# Patient Record
Sex: Female | Born: 1980 | Race: White | Hispanic: No | Marital: Married | Smoking: Current every day smoker
Health system: Southern US, Community
[De-identification: ages and names within clinical notes are randomized; demographics above are authoritative.]

## PROBLEM LIST (undated history)

## (undated) DIAGNOSIS — S82851A Displaced trimalleolar fracture of right lower leg, initial encounter for closed fracture: Secondary | ICD-10-CM

---
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
## 2006-02-20 HISTORY — PX: BREAST LUMPECTOMY: SHX2

## 2006-05-14 ENCOUNTER — Encounter: Admission: RE | Admit: 2006-05-14 | Discharge: 2006-05-14 | Payer: Self-pay | Admitting: *Deleted

## 2006-12-15 ENCOUNTER — Emergency Department (HOSPITAL_COMMUNITY): Admission: EM | Admit: 2006-12-15 | Discharge: 2006-12-16 | Payer: Self-pay | Admitting: Emergency Medicine

## 2007-03-11 ENCOUNTER — Ambulatory Visit (HOSPITAL_COMMUNITY): Admission: RE | Admit: 2007-03-11 | Discharge: 2007-03-11 | Payer: Self-pay | Admitting: *Deleted

## 2007-03-11 ENCOUNTER — Encounter (INDEPENDENT_AMBULATORY_CARE_PROVIDER_SITE_OTHER): Payer: Self-pay | Admitting: *Deleted

## 2007-03-11 HISTORY — PX: LAPAROSCOPIC OVARIAN CYSTECTOMY: SHX6248

## 2007-03-13 ENCOUNTER — Emergency Department (HOSPITAL_COMMUNITY): Admission: EM | Admit: 2007-03-13 | Discharge: 2007-03-13 | Payer: Self-pay | Admitting: Emergency Medicine

## 2009-09-12 ENCOUNTER — Emergency Department (HOSPITAL_COMMUNITY): Admission: EM | Admit: 2009-09-12 | Discharge: 2009-09-12 | Payer: Self-pay | Admitting: Emergency Medicine

## 2009-09-13 ENCOUNTER — Ambulatory Visit: Payer: Self-pay | Admitting: Psychiatry

## 2009-09-13 ENCOUNTER — Inpatient Hospital Stay (HOSPITAL_COMMUNITY)
Admission: AD | Admit: 2009-09-13 | Discharge: 2009-09-14 | Payer: Self-pay | Source: Home / Self Care | Admitting: Psychiatry

## 2010-03-23 ENCOUNTER — Emergency Department (HOSPITAL_BASED_OUTPATIENT_CLINIC_OR_DEPARTMENT_OTHER)
Admission: EM | Admit: 2010-03-23 | Discharge: 2010-03-23 | Disposition: A | Discharge status: Home / Self Care | Payer: Self-pay | Attending: Emergency Medicine | Admitting: Emergency Medicine

## 2010-03-23 ENCOUNTER — Other Ambulatory Visit (HOSPITAL_BASED_OUTPATIENT_CLINIC_OR_DEPARTMENT_OTHER): Payer: Self-pay

## 2010-03-23 ENCOUNTER — Emergency Department (INDEPENDENT_AMBULATORY_CARE_PROVIDER_SITE_OTHER): Payer: Self-pay

## 2010-03-23 ENCOUNTER — Ambulatory Visit (INDEPENDENT_AMBULATORY_CARE_PROVIDER_SITE_OTHER)
Admission: RE | Admit: 2010-03-23 | Discharge: 2010-03-23 | Disposition: A | Discharge status: Home / Self Care | Payer: Self-pay | Source: Ambulatory Visit | Attending: Emergency Medicine | Admitting: Emergency Medicine

## 2010-03-23 ENCOUNTER — Other Ambulatory Visit (HOSPITAL_BASED_OUTPATIENT_CLINIC_OR_DEPARTMENT_OTHER): Payer: Self-pay | Admitting: Emergency Medicine

## 2010-03-23 DIAGNOSIS — F17200 Nicotine dependence, unspecified, uncomplicated: Secondary | ICD-10-CM | POA: Insufficient documentation

## 2010-03-23 DIAGNOSIS — R52 Pain, unspecified: Secondary | ICD-10-CM

## 2010-03-23 DIAGNOSIS — N760 Acute vaginitis: Secondary | ICD-10-CM | POA: Insufficient documentation

## 2010-03-23 DIAGNOSIS — R109 Unspecified abdominal pain: Secondary | ICD-10-CM

## 2010-03-23 DIAGNOSIS — R1031 Right lower quadrant pain: Secondary | ICD-10-CM

## 2010-03-23 DIAGNOSIS — A499 Bacterial infection, unspecified: Secondary | ICD-10-CM | POA: Insufficient documentation

## 2010-03-23 DIAGNOSIS — B9689 Other specified bacterial agents as the cause of diseases classified elsewhere: Secondary | ICD-10-CM | POA: Insufficient documentation

## 2010-03-23 LAB — CBC
HCT: 40.8 % (ref 36.0–46.0)
MCH: 31.1 pg (ref 26.0–34.0)
Platelets: 292 10*3/uL (ref 150–400)
RDW: 13 % (ref 11.5–15.5)

## 2010-03-23 LAB — URINALYSIS, ROUTINE W REFLEX MICROSCOPIC
Bilirubin Urine: NEGATIVE
Hgb urine dipstick: NEGATIVE
Nitrite: NEGATIVE
Urine Glucose, Fasting: NEGATIVE mg/dL

## 2010-03-23 LAB — WET PREP, GENITAL

## 2010-03-23 LAB — DIFFERENTIAL
Eosinophils Absolute: 0.2 10*3/uL (ref 0.0–0.7)
Eosinophils Relative: 2 % (ref 0–5)
Monocytes Absolute: 1 10*3/uL (ref 0.1–1.0)
Monocytes Relative: 8 % (ref 3–12)
Neutro Abs: 8.3 10*3/uL — ABNORMAL HIGH (ref 1.7–7.7)

## 2010-03-23 LAB — BASIC METABOLIC PANEL
BUN: 20 mg/dL (ref 6–23)
CO2: 23 mEq/L (ref 19–32)
Calcium: 9.1 mg/dL (ref 8.4–10.5)
Creatinine, Ser: 0.8 mg/dL (ref 0.4–1.2)
GFR calc Af Amer: >60 mL/min (ref >60)

## 2010-03-23 MED ORDER — IOHEXOL 300 MG/ML  SOLN
100.0000 mL | Freq: Once | INTRAMUSCULAR | Status: DC | PRN
  Administered 2010-03-23: 100 mL via INTRAVENOUS

## 2010-03-25 LAB — GC/CHLAMYDIA PROBE AMP, GENITAL
Chlamydia, DNA Probe: NEGATIVE
GC Probe Amp, Genital: NEGATIVE

## 2010-05-07 LAB — DIFFERENTIAL
Basophils Relative: 0 % (ref 0–1)
Eosinophils Absolute: 0 10*3/uL (ref 0.0–0.7)
Eosinophils Relative: 0 % (ref 0–5)
Lymphs Abs: 1.6 10*3/uL (ref 0.7–4.0)
Monocytes Absolute: 0.5 10*3/uL (ref 0.1–1.0)
Monocytes Relative: 4 % (ref 3–12)
Neutrophils Relative %: 84 % — ABNORMAL HIGH (ref 43–77)

## 2010-05-07 LAB — POCT PREGNANCY, URINE: Preg Test, Ur: NEGATIVE

## 2010-05-07 LAB — CBC
HCT: 43.6 % (ref 36.0–46.0)
Hemoglobin: 15 g/dL (ref 12.0–15.0)
MCH: 32.6 pg (ref 26.0–34.0)
MCHC: 34.4 g/dL (ref 30.0–36.0)
MCV: 94.6 fL (ref 78.0–100.0)
RBC: 4.61 MIL/uL (ref 3.87–5.11)

## 2010-05-07 LAB — RAPID URINE DRUG SCREEN, HOSP PERFORMED
Amphetamines: NOT DETECTED
Tetrahydrocannabinol: NOT DETECTED

## 2010-05-07 LAB — ACETAMINOPHEN LEVEL
Acetaminophen (Tylenol), Serum: <10 ug/mL — ABNORMAL LOW (ref 10–30)
Acetaminophen (Tylenol), Serum: <10 ug/mL — ABNORMAL LOW (ref 10–30)

## 2010-05-07 LAB — BASIC METABOLIC PANEL
Calcium: 8.6 mg/dL (ref 8.4–10.5)
GFR calc Af Amer: >60 mL/min (ref >60)
Potassium: 4.9 mEq/L (ref 3.5–5.1)

## 2010-07-05 NOTE — Op Note (Signed)
NAMEJULIANNE, [name]                 ACCOUNT NO.:  [id number]   MEDICAL RECORD NO.:  [id number]          PATIENT TYPE:  AMB   LOCATION:  SDC                           FACILITY:  [facility]   PHYSICIAN:  [place] B. Earlene Plater, M.D.  DATE OF BIRTH:  1980/06/04   DATE OF PROCEDURE:  03/11/2007  DATE OF DISCHARGE:                               OPERATIVE REPORT   PREOPERATIVE DIAGNOSIS:  Left lower quadrant pain, left ovarian cyst.   POSTOPERATIVE DIAGNOSIS:  Left lower quadrant pain, left ovarian cyst.   PROCEDURE:  Laparoscopic left ovarian cystectomy.   SURGEON:  Marina Gravel, M.D.   ASSISTANT:  None.   ANESTHESIA:  General.   FINDINGS:  A 7 cm left ovary with approximately three simple-appearing  ovarian cysts.  No signs concerning for malignancy. Left tube is  attenuated over the cyst wall fimbria.  Although attenuated, otherwise  looked normal.  Right tube and ovary were normal.  Uterus normal.  Posterior cul-de-sac:  There was a window just medial to the left  uterosacral ligament, although no evidence of active endometriosis.  Filmy adhesions to the anterior cul-de-sac, although again no  endometriosis seen.  The upper abdomen appeared normal.   INDICATIONS:  The patient with a history of left lower quadrant pain,  large ovarian cyst on ultrasound which appears simple. Preop tumor  markers were negative.  The patient advised of the risks of surgery  including infection, bleeding, damage to surrounding organs.   PROCEDURE:  The patient was taken to the operating room and general  anesthesia obtained.  She was prepped and draped in standard fashion.  Bladder drained with a Foley catheter.  Hulka tenaculum attached to the  anterior lip of the cervix.   A 10 mm incision placed transversely in the umbilicus, carried sharply  to the fascia.  The fascia was divided sharply and elevated with Kocher  clamps.  Posterior sheath of peritoneum elevated and entered sharply.  A  pursestring suture  of 0 Vicryl placed around the fascial defect.  Hassan  cannula inserted and secured.  Pneumoperitoneum obtained with CO2 gas.  An 11 mm trocar placed in left lower quadrant, a 5 mm in the right, each  under direct laparoscopic visualization.   Trendelenburg position obtained.  Pelvis inspected with the above  findings noted.  Left ovary was noted to have filmy adhesions to the  posterior cul-de-sac which were taken down with monopolar scissors.  This mobilized the left ovary.  The cyst wall was dissected away from  the ovarian capsule, although it did rupture on dissection.  Clear fluid  drained. Cyst was removed with sharp and blunt dissection.  Secondary  cyst was noted just inferior to this, removed in a similar manner, and  there may have been a third which drained during the dissection. It was  not clear.  There was a substantial amount of normal appearing ovary  left after the dissection.  The line of dissection was made hemostatic  with the bipolar cautery, copiously irrigated and reinspected, found to  be hemostatic.  Pneumoperitoneum was taken down to  minimal settings and  line of dissection found to be hemostatic.   The inferior ports were removed.  Their sites were hemostatic.  The  scope was removed.  Gas released.  Hassan cannula removed.  Abdominal  wall elevated with [employer] retractors.  Pursestring suture snugged  down.  This obliterated the fascial defect and no intra-abdominal  contents herniated through prior closure.  Skin was closed at each site  with 4-0 Vicryl and Dermabond.   The patient tolerated the procedure well with no complications.  She was  taken to recovery room in stable condition. All counts correct per the  operating staff.      Gerri Spore B. Earlene Plater, M.D.  Electronically Signed     WBD/MEDQ  D:  03/11/2007  T:  03/12/2007  Job:  [id number]

## 2010-11-10 LAB — DIFFERENTIAL
Eosinophils Absolute: 0.3
Lymphs Abs: 2.3
Monocytes Relative: 7
Neutro Abs: 5.5
Neutrophils Relative %: 62

## 2010-11-10 LAB — CBC
MCV: 90.5
Platelets: 337
RBC: 4.57
WBC: 8.9

## 2010-11-10 LAB — PREGNANCY, URINE: Preg Test, Ur: NEGATIVE

## 2010-11-11 LAB — COMPREHENSIVE METABOLIC PANEL
BUN: 7
CO2: 25
Calcium: 8.9
Chloride: 105
Creatinine, Ser: 0.72
GFR calc non Af Amer: >60
Total Bilirubin: 0.8

## 2010-11-11 LAB — CBC
HCT: 36.7
MCHC: 33.8
MCV: 91
RBC: 4.04
WBC: 10.7 — ABNORMAL HIGH

## 2010-11-11 LAB — URINALYSIS, ROUTINE W REFLEX MICROSCOPIC
Bilirubin Urine: NEGATIVE
Glucose, UA: NEGATIVE
Ketones, ur: 15 — AB
Leukocytes, UA: NEGATIVE
Nitrite: NEGATIVE
Protein, ur: 30 — AB
Specific Gravity, Urine: >1.046 — ABNORMAL HIGH
Urobilinogen, UA: 1
pH: 7.5

## 2010-11-11 LAB — DIFFERENTIAL
Basophils Absolute: 0.1
Eosinophils Relative: 3
Lymphocytes Relative: 31
Lymphs Abs: 3.3
Neutro Abs: 6.3
Neutrophils Relative %: 59

## 2010-11-11 LAB — POCT PREGNANCY, URINE
Operator id: 27065
Preg Test, Ur: NEGATIVE

## 2010-11-11 LAB — URINE MICROSCOPIC-ADD ON

## 2010-11-11 LAB — LIPASE, BLOOD: Lipase: 21

## 2010-11-30 LAB — URINALYSIS, ROUTINE W REFLEX MICROSCOPIC
Hgb urine dipstick: NEGATIVE
Nitrite: NEGATIVE
Protein, ur: NEGATIVE
Urobilinogen, UA: 0.2

## 2010-11-30 LAB — WET PREP, GENITAL
Trich, Wet Prep: NONE SEEN
Yeast Wet Prep HPF POC: NONE SEEN

## 2010-11-30 LAB — DIFFERENTIAL
Basophils Absolute: 0
Basophils Relative: 0
Eosinophils Absolute: 0.3
Eosinophils Relative: 4
Lymphocytes Relative: 24
Lymphs Abs: 2.4
Monocytes Absolute: 0.6
Monocytes Relative: 7
Neutro Abs: 6.3
Neutrophils Relative %: 66

## 2010-11-30 LAB — CBC
HCT: 38.7
Hemoglobin: 13.3
MCHC: 34.3
MCV: 90.2
Platelets: 281
RBC: 4.28
RDW: 13.6
WBC: 9.7

## 2010-11-30 LAB — PREGNANCY, URINE: Preg Test, Ur: NEGATIVE

## 2010-11-30 LAB — GC/CHLAMYDIA PROBE AMP, GENITAL
Chlamydia, DNA Probe: NEGATIVE
GC Probe Amp, Genital: NEGATIVE

## 2010-11-30 LAB — URINE MICROSCOPIC-ADD ON

## 2012-02-05 ENCOUNTER — Ambulatory Visit (INDEPENDENT_AMBULATORY_CARE_PROVIDER_SITE_OTHER): Payer: Managed Care, Other (non HMO) | Admitting: Internal Medicine

## 2012-02-05 VITALS — BP 135/83 | HR 84 | Temp 98.5°F | Resp 16 | Ht 65.3 in | Wt 234.4 lb

## 2012-02-05 DIAGNOSIS — J9801 Acute bronchospasm: Secondary | ICD-10-CM

## 2012-02-05 DIAGNOSIS — R05 Cough: Secondary | ICD-10-CM

## 2012-02-05 DIAGNOSIS — F17200 Nicotine dependence, unspecified, uncomplicated: Secondary | ICD-10-CM

## 2012-02-05 DIAGNOSIS — J329 Chronic sinusitis, unspecified: Secondary | ICD-10-CM

## 2012-02-05 DIAGNOSIS — J45909 Unspecified asthma, uncomplicated: Secondary | ICD-10-CM

## 2012-02-05 DIAGNOSIS — Z6838 Body mass index (BMI) 38.0-38.9, adult: Secondary | ICD-10-CM | POA: Insufficient documentation

## 2012-02-05 MED ORDER — ALBUTEROL SULFATE HFA 108 (90 BASE) MCG/ACT IN AERS: 2.0000 | INHALATION_SPRAY | Freq: Four times a day (QID) | RESPIRATORY_TRACT | Status: DC | PRN

## 2012-02-05 MED ORDER — HYDROCODONE-HOMATROPINE 5-1.5 MG/5ML PO SYRP: 5.0000 mL | ORAL_SOLUTION | Freq: Four times a day (QID) | ORAL | Status: AC | PRN

## 2012-02-05 MED ORDER — AMOXICILLIN 500 MG PO CAPS: 1000.0000 mg | ORAL_CAPSULE | Freq: Two times a day (BID) | ORAL | Status: AC

## 2012-02-05 NOTE — Progress Notes (Signed)
  Subjective:    Patient ID: [name], female    DOB: 19-Apr-1980, 31 y.o.   MRN: [id number]  HPI had a bad cold 2 weeks ago and has continued with a cough ever since Cough day and night, nonproductive No fever Hard to breathe at night No known asthma, but hx needing inhalers w/ URIs LRIs   Chronic smoker---down to 12--trying motivational class at work Afraid of chantix/says she would like to quit/smokes all cigs before or after work  Review of Systems No fever chills or night sweats No chest wall pain    Objective:   Physical Exam Vital signs stable except obese TMs clear Nares boggy with purulent discharge/tender bilateral maxillary sinuses Throat clear/no cervical nodes Chest with wheezing bilaterally on forced expiration/no rales Heart regular without murmur      mirena Assessment & Plan:   1. Sinusitis   2. Nicotine addiction   3. Cough   4. Bronchospasm   5. BMI 38.0-38.9,adult   6. RAD (reactive airway disease)    Discussed gradual cessation method Meds ordered this encounter  Medications         . amoxicillin (AMOXIL) 500 MG capsule    Sig: Take 2 capsules (1,000 mg total) by mouth 2 (two) times daily.    Dispense:  40 capsule    Refill:  0  . HYDROcodone-homatropine (HYCODAN) 5-1.5 MG/5ML syrup    Sig: Take 5 mLs by mouth every 6 (six) hours as needed for cough.    Dispense:  120 mL    Refill:  0  . albuterol (PROVENTIL HFA;VENTOLIN HFA) 108 (90 BASE) MCG/ACT inhaler    Sig: Inhale 2 puffs into the lungs every 6 (six) hours as needed for wheezing.    Dispense:  1 Inhaler    Refill:  0  Needs overall plan for health given smoking and obesity-advised f/u

## 2012-02-23 ENCOUNTER — Ambulatory Visit (INDEPENDENT_AMBULATORY_CARE_PROVIDER_SITE_OTHER): Payer: Managed Care, Other (non HMO) | Admitting: Family Medicine

## 2012-02-23 VITALS — BP 114/81 | HR 88 | Temp 99.2°F | Resp 18 | Wt 238.0 lb

## 2012-02-23 DIAGNOSIS — R05 Cough: Secondary | ICD-10-CM

## 2012-02-23 DIAGNOSIS — J45909 Unspecified asthma, uncomplicated: Secondary | ICD-10-CM

## 2012-02-23 DIAGNOSIS — J9801 Acute bronchospasm: Secondary | ICD-10-CM

## 2012-02-23 LAB — POCT INFLUENZA A/B
Influenza A, POC: NEGATIVE
Influenza B, POC: NEGATIVE

## 2012-02-23 LAB — POCT CBC
HCT, POC: 48 % — AB (ref 37.7–47.9)
Lymph, poc: 2.8 (ref 0.6–3.4)
MCH, POC: 29.2 pg (ref 27–31.2)
MCHC: 31.3 g/dL — AB (ref 31.8–35.4)
MCV: 93.3 fL (ref 80–97)
POC LYMPH PERCENT: 23.4 %L (ref 10–50)
RDW, POC: 13.6 %

## 2012-02-23 MED ORDER — BENZONATATE 100 MG PO CAPS: 100.0000 mg | ORAL_CAPSULE | Freq: Three times a day (TID) | ORAL | Status: DC | PRN

## 2012-02-23 MED ORDER — AZITHROMYCIN 250 MG PO TABS: ORAL_TABLET | ORAL | Status: DC

## 2012-02-23 NOTE — Progress Notes (Signed)
Subjective:    Patient ID: [name], female    DOB: 17-Nov-1980, 32 y.o.   MRN: [id number]  HPI [name] is a 32 y.o. female Seen 02/05/12 for cough, bronchospasm. tx with albuterol, hycodan, amoxil. Improved, cough gone, then started up with cough again 3 days ago.  No fever, no bodyaches, slight nasal congestion. No heartburn.  Dry cough. Occasional coughing fits. No chest tightness.  Medical coder.  Multiple sick contacts.  [facility].  Smoker - but quitting.  Started blu - electronic cigarettes only over past week.  Smoking these about 2-4 times per day - prior 1ppd.   Breathe program through work to quit smoking.     No flu shot this year.  Tx: albuterol as needed - 2 -3 times per day last few days. No other tx.    Review of Systems  Constitutional: Negative for fever and chills.  HENT: Positive for congestion. Negative for sore throat (scratchy only with cough. ).   Respiratory: Positive for cough. Negative for shortness of breath.   Musculoskeletal: Positive for myalgias.       Objective:   Physical Exam  Constitutional: She is oriented to person, place, and time. She appears well-developed and well-nourished. No distress.  HENT:  Head: Normocephalic and atraumatic.  Right Ear: Hearing, tympanic membrane, external ear and ear canal normal.  Left Ear: Hearing, tympanic membrane, external ear and ear canal normal.  Nose: Nose normal.  Mouth/Throat: Oropharynx is clear and moist. No oropharyngeal exudate.  Eyes: Conjunctivae normal and EOM are normal. Pupils are equal, round, and reactive to light.  Cardiovascular: Normal rate, regular rhythm, normal heart sounds and intact distal pulses.   No murmur heard. Pulmonary/Chest: Effort normal and breath sounds normal. No respiratory distress. She has no wheezes. She has no rhonchi.       Coughs during exam, but clear, no apparent wheeze.   Neurological: She is alert and oriented to person, place, and time.  Skin: Skin is  warm and dry. No rash noted.  Psychiatric: She has a normal mood and affect. Her behavior is normal.   Results for orders placed in visit on 02/23/12  POCT INFLUENZA A/B      Component Value Range   Influenza A, POC Negative     Influenza B, POC Negative    POCT CBC      Component Value Range   WBC 12.0 (*) 4.6 - 10.2 K/uL   Lymph, poc 2.8  0.6 - 3.4   POC LYMPH PERCENT 23.4  10 - 50 %L   MID (cbc) 0.6  0 - 0.9   POC MID % 4.9  0 - 12 %M   POC Granulocyte 8.6 (*) 2 - 6.9   Granulocyte percent 71.7  37 - 80 %G   RBC 5.14  4.04 - 5.48 M/uL   Hemoglobin 15.0  12.2 - 16.2 g/dL   HCT, POC 40.9 (*) 81.1 - 47.9 %   MCV 93.3  80 - 97 fL   MCH, POC 29.2  27 - 31.2 pg   MCHC 31.3 (*) 31.8 - 35.4 g/dL   RDW, POC 91.4     Platelet Count, POC 385  142 - 424 K/uL   MPV 9.1  0 - 99.8 fL      Assessment & Plan:  [name] is a 32 y.o. female 1. Cough  POCT Influenza A/B, POCT CBC, benzonatate (TESSALON) 100 MG capsule  2. Bronchospasm  POCT Influenza A/B, POCT  CBC  3. Asthmatic bronchitis  azithromycin (ZITHROMAX) 250 MG tablet   Suspected asthmatic bronchitis - no wheeze on exam.  Start z pak, albuterol Q4-6hprn.  Tessalon tid prn, rtc precautions.  Groggy with hycodan prior, but if need can call in refill - caution not to take if wheezing/dyspneic. rtc precautions.   Patient Instructions  Start the antibiotic, tessalon if needed for cough during the day. Albuterol if needed up to every 4 to 6 hours as needed. If increase use of this, or still needing more than 2 times per day in 3-4 days - return for recheck.   Return to the clinic or go to the nearest emergency room if any of your symptoms worsen or new symptoms occur.

## 2012-02-23 NOTE — Patient Instructions (Signed)
Start the antibiotic, tessalon if needed for cough during the day. Albuterol if needed up to every 4 to 6 hours as needed. If increase use of this, or still needing more than 2 times per day in 3-4 days - return for recheck.   Return to the clinic or go to the nearest emergency room if any of your symptoms worsen or new symptoms occur.

## 2012-11-21 ENCOUNTER — Ambulatory Visit: Payer: Managed Care, Other (non HMO)

## 2012-11-21 ENCOUNTER — Ambulatory Visit (INDEPENDENT_AMBULATORY_CARE_PROVIDER_SITE_OTHER): Payer: Managed Care, Other (non HMO) | Admitting: Family Medicine

## 2012-11-21 VITALS — BP 128/72 | HR 82 | Temp 98.4°F | Resp 18 | Ht 64.5 in | Wt 228.6 lb

## 2012-11-21 DIAGNOSIS — S20219A Contusion of unspecified front wall of thorax, initial encounter: Secondary | ICD-10-CM

## 2012-11-21 DIAGNOSIS — R0781 Pleurodynia: Secondary | ICD-10-CM

## 2012-11-21 DIAGNOSIS — R079 Chest pain, unspecified: Secondary | ICD-10-CM

## 2012-11-21 DIAGNOSIS — S20211A Contusion of right front wall of thorax, initial encounter: Secondary | ICD-10-CM

## 2012-11-21 MED ORDER — TRAMADOL HCL 50 MG PO TABS: 50.0000 mg | ORAL_TABLET | Freq: Four times a day (QID) | ORAL | Status: DC | PRN

## 2012-11-21 NOTE — Progress Notes (Signed)
[address]   [place], [place]  [address]   [id number]  Subjective:    Patient ID: [name], female    DOB: [birth year], 32 y.o.   MRN: [id number]  HPI This 32 y.o. female presents for evaluation of L rib pain.  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  No major injury.  Aleve 6 today.  No SOB.  Pain with movement and coughing.    PCP: [facility].  Review of Systems  Constitutional: Negative for fever, chills, diaphoresis and fatigue.  Musculoskeletal: Positive for myalgias and back pain. Negative for joint swelling, arthralgias and gait problem.  Skin: Negative for color change, pallor, rash and wound.  Neurological: Negative for weakness and numbness.   Past Medical History  Diagnosis Date  . Allergy    Past Surgical History  Procedure Laterality Date  . Appendectomy    . Breast surgery    . Ovarian cyst removal     No Known Allergies Current Outpatient Prescriptions on File Prior to Visit  Medication Sig Dispense Refill  . albuterol (PROVENTIL HFA;VENTOLIN HFA) 108 (90 BASE) MCG/ACT inhaler Inhale 2 puffs into the lungs every 6 (six) hours as needed for wheezing.  1 Inhaler  0  . levonorgestrel (MIRENA) 20 MCG/24HR IUD 1 each by Intrauterine route once.      Marland Kitchen azithromycin (ZITHROMAX) 250 MG tablet Take 2 pills by mouth on day 1, then 1 pill by mouth per day on days 2 through 5.  6 each  0  . benzonatate (TESSALON) 100 MG capsule Take 1 capsule (100 mg total) by mouth 3 (three) times daily as needed for cough.  20 capsule  0   No current facility-administered medications on file prior to visit.   History   Social History  . Marital Status: Single    Spouse Name: N/A    Number of Children: N/A  . Years of Education: N/A   Occupational History  . Not on file.   Social History Main Topics  . Smoking status: Current Every Day Smoker -- 1.00 packs/day for 10 years    Types: Cigarettes  . Smokeless tobacco: Not on file  . Alcohol Use: No  . Drug Use:  No  . Sexual Activity: Yes    Birth Control/ Protection: IUD   Other Topics Concern  . Not on file   Social History Narrative  . No narrative on file       Objective:   Physical Exam  Nursing note and vitals reviewed. Constitutional: She is oriented to person, place, and time. She appears well-developed and well-nourished. No distress.  HENT:  Head: Normocephalic and atraumatic.  Cardiovascular: Normal rate, regular rhythm and normal heart sounds.  Exam reveals no gallop and no friction rub.   No murmur heard. Pulmonary/Chest: Effort normal and breath sounds normal. She has no wheezes. She has no rales.  Musculoskeletal:       Right shoulder: Normal.       Left shoulder: Normal.       Cervical back: Normal.       Thoracic back: Normal.  +TTP R lateral rib region and into axillary region; mild pain with flexion/extension lumbar region.  Pain with rotation side to side.   Neurological: She is alert and oriented to person, place, and time.  Skin: Skin is warm and dry. No rash noted. She is not diaphoretic.  Psychiatric: She has a normal mood and affect. Her behavior is normal.  [facility] reading (PRIMARY) by  Dr. Katrinka Blazing. R RIBS: NAD.  Assessment & Plan:  Rib pain on right side - Plan: DG Ribs Unilateral W/Chest Right  Contusion of ribs, right, initial encounter  1.  Rib pain R:  New.  Recommend continuing Aleve bid.  Rx for Tramadol for moderate pain. 2. Rib contusion R:  New.  Recommend rest, stretching, NSAIDs.  Recommend ice to area.  Avoid lifting for the next two weeks.     Meds ordered this encounter  Medications  . traMADol (ULTRAM) 50 MG tablet    Sig: Take 1-2 tablets (50-100 mg total) by mouth every 6 (six) hours as needed for pain.    Dispense:  40 tablet    Refill:  0

## 2012-12-13 DIAGNOSIS — S82851A Displaced trimalleolar fracture of right lower leg, initial encounter for closed fracture: Secondary | ICD-10-CM

## 2012-12-13 HISTORY — DX: Displaced trimalleolar fracture of right lower leg, initial encounter for closed fracture: S82.851A

## 2012-12-16 ENCOUNTER — Encounter (HOSPITAL_BASED_OUTPATIENT_CLINIC_OR_DEPARTMENT_OTHER): Payer: Self-pay | Admitting: *Deleted

## 2012-12-20 ENCOUNTER — Encounter (HOSPITAL_BASED_OUTPATIENT_CLINIC_OR_DEPARTMENT_OTHER): Payer: Self-pay | Admitting: *Deleted

## 2012-12-20 ENCOUNTER — Encounter (HOSPITAL_BASED_OUTPATIENT_CLINIC_OR_DEPARTMENT_OTHER): Payer: Managed Care, Other (non HMO) | Admitting: Anesthesiology

## 2012-12-20 ENCOUNTER — Encounter (HOSPITAL_BASED_OUTPATIENT_CLINIC_OR_DEPARTMENT_OTHER)
Admission: RE | Disposition: A | Discharge status: Home / Self Care | Payer: Self-pay | Source: Ambulatory Visit | Attending: Orthopedic Surgery

## 2012-12-20 ENCOUNTER — Ambulatory Visit (HOSPITAL_BASED_OUTPATIENT_CLINIC_OR_DEPARTMENT_OTHER): Payer: Managed Care, Other (non HMO) | Admitting: Anesthesiology

## 2012-12-20 ENCOUNTER — Ambulatory Visit (HOSPITAL_BASED_OUTPATIENT_CLINIC_OR_DEPARTMENT_OTHER)
Admission: RE | Admit: 2012-12-20 | Discharge: 2012-12-20 | Disposition: A | Discharge status: Home / Self Care | Payer: Managed Care, Other (non HMO) | Source: Ambulatory Visit | Attending: Orthopedic Surgery | Admitting: Orthopedic Surgery

## 2012-12-20 DIAGNOSIS — Z79899 Other long term (current) drug therapy: Secondary | ICD-10-CM | POA: Insufficient documentation

## 2012-12-20 DIAGNOSIS — S82853A Displaced trimalleolar fracture of unspecified lower leg, initial encounter for closed fracture: Secondary | ICD-10-CM | POA: Insufficient documentation

## 2012-12-20 DIAGNOSIS — X58XXXA Exposure to other specified factors, initial encounter: Secondary | ICD-10-CM | POA: Insufficient documentation

## 2012-12-20 DIAGNOSIS — Y9351 Activity, roller skating (inline) and skateboarding: Secondary | ICD-10-CM | POA: Insufficient documentation

## 2012-12-20 DIAGNOSIS — F17200 Nicotine dependence, unspecified, uncomplicated: Secondary | ICD-10-CM | POA: Insufficient documentation

## 2012-12-20 DIAGNOSIS — Y9239 Other specified sports and athletic area as the place of occurrence of the external cause: Secondary | ICD-10-CM | POA: Insufficient documentation

## 2012-12-20 DIAGNOSIS — S82891A Other fracture of right lower leg, initial encounter for closed fracture: Secondary | ICD-10-CM

## 2012-12-20 HISTORY — PX: ORIF ANKLE FRACTURE: SHX5408

## 2012-12-20 HISTORY — DX: Displaced trimalleolar fracture of right lower leg, initial encounter for closed fracture: S82.851A

## 2012-12-20 SURGERY — OPEN REDUCTION INTERNAL FIXATION (ORIF) ANKLE FRACTURE
Anesthesia: General | Site: Ankle | Laterality: Right | Wound class: Clean

## 2012-12-20 MED ORDER — OXYCODONE HCL 5 MG PO TABS
ORAL_TABLET | ORAL | Status: AC
  Filled 2012-12-20: qty 1

## 2012-12-20 MED ORDER — LACTATED RINGERS IV SOLN
INTRAVENOUS | Status: DC
  Administered 2012-12-20: 13:00:00 via INTRAVENOUS
  Administered 2012-12-20: 20 mL/h via INTRAVENOUS
  Administered 2012-12-20: 12:00:00 via INTRAVENOUS

## 2012-12-20 MED ORDER — MIDAZOLAM HCL 2 MG/2ML IJ SOLN
INTRAMUSCULAR | Status: AC
  Filled 2012-12-20: qty 2

## 2012-12-20 MED ORDER — PROPOFOL 10 MG/ML IV BOLUS
INTRAVENOUS | Status: AC
  Filled 2012-12-20: qty 20

## 2012-12-20 MED ORDER — PROPOFOL 10 MG/ML IV BOLUS
INTRAVENOUS | Status: DC | PRN
  Administered 2012-12-20: 200 mg via INTRAVENOUS

## 2012-12-20 MED ORDER — OXYCODONE HCL 5 MG PO TABS
5.0000 mg | ORAL_TABLET | Freq: Once | ORAL | Status: AC | PRN
  Administered 2012-12-20: 5 mg via ORAL

## 2012-12-20 MED ORDER — FENTANYL CITRATE 0.05 MG/ML IJ SOLN
INTRAMUSCULAR | Status: AC
  Filled 2012-12-20: qty 2

## 2012-12-20 MED ORDER — CEFAZOLIN SODIUM-DEXTROSE 2-3 GM-% IV SOLR
INTRAVENOUS | Status: AC
  Filled 2012-12-20: qty 50

## 2012-12-20 MED ORDER — DEXAMETHASONE SODIUM PHOSPHATE 10 MG/ML IJ SOLN
INTRAMUSCULAR | Status: DC | PRN
  Administered 2012-12-20: 10 mg via INTRAVENOUS

## 2012-12-20 MED ORDER — DEXTROSE-NACL 5-0.45 % IV SOLN: 100.0000 mL/h | INTRAVENOUS | Status: DC

## 2012-12-20 MED ORDER — DEXTROSE 5 % IV SOLN
3.0000 g | INTRAVENOUS | Status: AC
  Administered 2012-12-20: 3 g via INTRAVENOUS

## 2012-12-20 MED ORDER — ONDANSETRON HCL 4 MG/2ML IJ SOLN
INTRAMUSCULAR | Status: DC | PRN
  Administered 2012-12-20: 4 mg via INTRAVENOUS

## 2012-12-20 MED ORDER — CHLORHEXIDINE GLUCONATE 4 % EX LIQD: 60.0000 mL | Freq: Once | CUTANEOUS | Status: DC

## 2012-12-20 MED ORDER — ACETAMINOPHEN 500 MG PO TABS: 1000.0000 mg | ORAL_TABLET | Freq: Once | ORAL | Status: DC

## 2012-12-20 MED ORDER — HYDROMORPHONE HCL PF 1 MG/ML IJ SOLN
INTRAMUSCULAR | Status: AC
  Filled 2012-12-20: qty 1

## 2012-12-20 MED ORDER — FENTANYL CITRATE 0.05 MG/ML IJ SOLN
INTRAMUSCULAR | Status: DC | PRN
  Administered 2012-12-20: 100 ug via INTRAVENOUS
  Administered 2012-12-20 (×2): 50 ug via INTRAVENOUS

## 2012-12-20 MED ORDER — MIDAZOLAM HCL 2 MG/ML PO SYRP: 12.0000 mg | ORAL_SOLUTION | Freq: Once | ORAL | Status: DC | PRN

## 2012-12-20 MED ORDER — OXYCODONE HCL 5 MG/5ML PO SOLN: 5.0000 mg | Freq: Once | ORAL | Status: AC | PRN

## 2012-12-20 MED ORDER — OXYCODONE-ACETAMINOPHEN 5-325 MG PO TABS: 2.0000 | ORAL_TABLET | ORAL | Status: AC | PRN

## 2012-12-20 MED ORDER — FENTANYL CITRATE 0.05 MG/ML IJ SOLN
INTRAMUSCULAR | Status: AC
  Filled 2012-12-20: qty 4

## 2012-12-20 MED ORDER — HYDROMORPHONE HCL PF 1 MG/ML IJ SOLN
0.2500 mg | INTRAMUSCULAR | Status: DC | PRN
  Administered 2012-12-20 (×3): 0.5 mg via INTRAVENOUS

## 2012-12-20 MED ORDER — PROMETHAZINE HCL 25 MG/ML IJ SOLN: 6.2500 mg | INTRAMUSCULAR | Status: DC | PRN

## 2012-12-20 MED ORDER — ONDANSETRON HCL 4 MG PO TABS: 4.0000 mg | ORAL_TABLET | Freq: Three times a day (TID) | ORAL | Status: AC | PRN

## 2012-12-20 MED ORDER — MIDAZOLAM HCL 2 MG/2ML IJ SOLN
1.0000 mg | INTRAMUSCULAR | Status: DC | PRN
  Administered 2012-12-20: 2 mg via INTRAVENOUS

## 2012-12-20 MED ORDER — FENTANYL CITRATE 0.05 MG/ML IJ SOLN
50.0000 ug | INTRAMUSCULAR | Status: DC | PRN
  Administered 2012-12-20: 100 ug via INTRAVENOUS

## 2012-12-20 MED ORDER — 0.9 % SODIUM CHLORIDE (POUR BTL) OPTIME
TOPICAL | Status: DC | PRN
  Administered 2012-12-20: 300 mL

## 2012-12-20 SURGICAL SUPPLY — 91 items
2.0 DRILL BIT ×1 IMPLANT
APL SKNCLS STERI-STRIP NONHPOA (GAUZE/BANDAGES/DRESSINGS) ×1
BANDAGE ELASTIC 4 VELCRO ST LF (GAUZE/BANDAGES/DRESSINGS) ×1 IMPLANT
BANDAGE ELASTIC 6 VELCRO ST LF (GAUZE/BANDAGES/DRESSINGS) ×1 IMPLANT
BANDAGE ESMARK 6X9 LF (GAUZE/BANDAGES/DRESSINGS) IMPLANT
BANDAGE GAUZE ELAST BULKY 4 IN (GAUZE/BANDAGES/DRESSINGS) ×1 IMPLANT
BENZOIN TINCTURE PRP APPL 2/3 (GAUZE/BANDAGES/DRESSINGS) ×2 IMPLANT
BIT DRILL 2.5X125 (BIT) ×1 IMPLANT
BIT DRILL CANN 2.7 (BIT) ×2
BIT DRILL SRG 2.7XCANN AO CPLG (BIT) IMPLANT
BIT DRL SRG 2.7XCANN AO CPLNG (BIT) ×1
BLADE SURG 15 STRL LF DISP TIS (BLADE) ×2 IMPLANT
BLADE SURG 15 STRL SS (BLADE) ×4
BNDG CMPR 9X4 STRL LF SNTH (GAUZE/BANDAGES/DRESSINGS)
BNDG CMPR 9X6 STRL LF SNTH (GAUZE/BANDAGES/DRESSINGS) ×1
BNDG COHESIVE 4X5 TAN STRL (GAUZE/BANDAGES/DRESSINGS) ×2 IMPLANT
BNDG ESMARK 4X9 LF (GAUZE/BANDAGES/DRESSINGS) ×1 IMPLANT
BNDG ESMARK 6X9 LF (GAUZE/BANDAGES/DRESSINGS) ×2
CHLORAPREP W/TINT 26ML (MISCELLANEOUS) ×2 IMPLANT
COVER TABLE BACK 60X90 (DRAPES) ×3 IMPLANT
CUFF TOURNIQUET SINGLE 24IN (TOURNIQUET CUFF) IMPLANT
CUFF TOURNIQUET SINGLE 34IN LL (TOURNIQUET CUFF) ×1 IMPLANT
DECANTER SPIKE VIAL GLASS SM (MISCELLANEOUS) IMPLANT
DRAPE EXTREMITY T 121X128X90 (DRAPE) ×2 IMPLANT
DRAPE OEC MINIVIEW 54X84 (DRAPES) ×2 IMPLANT
DRAPE SURG 17X23 STRL (DRAPES) IMPLANT
DRAPE U 20/CS (DRAPES) ×2 IMPLANT
DRAPE U-SHAPE 47X51 STRL (DRAPES) ×2 IMPLANT
DRSG EMULSION OIL 3X3 NADH (GAUZE/BANDAGES/DRESSINGS) ×3 IMPLANT
DURA STEPPER MED (CAST SUPPLIES) ×1 IMPLANT
ELECT REM PT RETURN 9FT ADLT (ELECTROSURGICAL) ×2
ELECTRODE REM PT RTRN 9FT ADLT (ELECTROSURGICAL) ×1 IMPLANT
GAUZE XEROFORM 1X8 LF (GAUZE/BANDAGES/DRESSINGS) IMPLANT
GLOVE BIO SURGEON STRL SZ7.5 (GLOVE) ×2 IMPLANT
GLOVE BIO SURGEON STRL SZ8 (GLOVE) ×1 IMPLANT
GLOVE BIOGEL PI IND STRL 7.0 (GLOVE) ×1 IMPLANT
GLOVE BIOGEL PI IND STRL 8 (GLOVE) ×1 IMPLANT
GLOVE BIOGEL PI INDICATOR 7.0 (GLOVE) ×1
GLOVE BIOGEL PI INDICATOR 8 (GLOVE) ×1
GLOVE ECLIPSE 6.5 STRL STRAW (GLOVE) ×1 IMPLANT
GLOVE EXAM NITRILE MD LF STRL (GLOVE) ×1 IMPLANT
GOWN PREVENTION PLUS XLARGE (GOWN DISPOSABLE) ×4 IMPLANT
K-WIRE ORTHOPEDIC 1.4X150L (WIRE) ×4
KWIRE ORTHOPEDIC 1.4X150L (WIRE) ×2 IMPLANT
NEEDLE HYPO 22GX1.5 SAFETY (NEEDLE) ×1 IMPLANT
NS IRRIG 1000ML POUR BTL (IV SOLUTION) ×2 IMPLANT
PACK BASIN DAY SURGERY FS (CUSTOM PROCEDURE TRAY) ×2 IMPLANT
PAD CAST 4YDX4 CTTN HI CHSV (CAST SUPPLIES) ×1 IMPLANT
PADDING CAST ABS 4INX4YD NS (CAST SUPPLIES) ×1
PADDING CAST ABS COTTON 4X4 ST (CAST SUPPLIES) ×1 IMPLANT
PADDING CAST COTTON 4X4 STRL (CAST SUPPLIES) ×2
PENCIL BUTTON HOLSTER BLD 10FT (ELECTRODE) ×2 IMPLANT
PLATE TUBULAR 8 HOLE 1/3 (Plate) ×1 IMPLANT
SCREW CANC 4.0X14 (Screw) ×2 IMPLANT
SCREW CANC 4.0X16 (Screw) ×1 IMPLANT
SCREW CANN 44X15X4XSLF DRL (Screw) IMPLANT
SCREW CANNULATED 4.0X44MM (Screw) ×4 IMPLANT
SCREW CORT 10X3.5XST NS LF (Screw) IMPLANT
SCREW CORTEX ST MATTA 3.5X12MM (Screw) ×2 IMPLANT
SCREW CORTEX ST MATTA 3.5X14 (Screw) ×2 IMPLANT
SCREW CORTEX ST MATTA 3.5X18MM (Screw) ×1 IMPLANT
SCREW CORTICAL 2.7X14 (Screw) ×4 IMPLANT
SCREW CORTICAL 2.7X16 (Screw) ×1 IMPLANT
SCREW CORTICAL 3.5 (Screw) ×2 IMPLANT
SHEET MEDIUM DRAPE 40X70 STRL (DRAPES) IMPLANT
SLEEVE SCD COMPRESS KNEE MED (MISCELLANEOUS) ×1 IMPLANT
SPLINT FAST PLASTER 5X30 (CAST SUPPLIES)
SPLINT PLASTER CAST FAST 5X30 (CAST SUPPLIES) IMPLANT
SPLINT PLASTER CAST XFAST 3X15 (CAST SUPPLIES) IMPLANT
SPLINT PLASTER XTRA FASTSET 3X (CAST SUPPLIES)
SPONGE GAUZE 4X4 12PLY (GAUZE/BANDAGES/DRESSINGS) ×2 IMPLANT
SPONGE LAP 4X18 X RAY DECT (DISPOSABLE) ×2 IMPLANT
STOCKINETTE 4X48 STRL (DRAPES) IMPLANT
STOCKINETTE 6  STRL (DRAPES) ×1
STOCKINETTE 6 STRL (DRAPES) ×1 IMPLANT
STRIP CLOSURE SKIN 1/2X4 (GAUZE/BANDAGES/DRESSINGS) ×2 IMPLANT
SUCTION FRAZIER TIP 10 FR DISP (SUCTIONS) ×1 IMPLANT
SUT ETHILON 3 0 PS 1 (SUTURE) IMPLANT
SUT MNCRL AB 4-0 PS2 18 (SUTURE) ×1 IMPLANT
SUT MON AB 2-0 CT1 36 (SUTURE) ×3 IMPLANT
SUT MON AB 4-0 PC3 18 (SUTURE) ×1 IMPLANT
SUT PROLENE 3 0 PS 2 (SUTURE) IMPLANT
SUT VIC AB 0 SH 27 (SUTURE) ×2 IMPLANT
SUT VIC AB 2-0 SH 27 (SUTURE)
SUT VIC AB 2-0 SH 27XBRD (SUTURE) IMPLANT
SUT VIC AB 3-0 FS2 27 (SUTURE) IMPLANT
SYR 20CC LL (SYRINGE) ×1 IMPLANT
SYR BULB 3OZ (MISCELLANEOUS) ×2 IMPLANT
TOWEL OR 17X24 6PK STRL BLUE (TOWEL DISPOSABLE) ×4 IMPLANT
TUBE CONNECTING 20X1/4 (TUBING) ×1 IMPLANT
UNDERPAD 30X30 INCONTINENT (UNDERPADS AND DIAPERS) ×2 IMPLANT

## 2012-12-20 NOTE — Interval H&P Note (Signed)
History and Physical Interval Note:  12/20/2012 12:37 PM  [name]  has presented today for surgery, with the diagnosis of RIGHT ANKLE TRIMALLEOLAR FRACTURE  The various methods of treatment have been discussed with the patient and family. After consideration of risks, benefits and other options for treatment, the patient has consented to  Procedure(s): RIGHT OPEN REDUCTION INTERNAL FIXATION (ORIF) POSTERIOR LIP DISTAl TIBIOFIBULAR FRACTURE (Right) as a surgical intervention .  The patient's history has been reviewed, patient examined, no change in status, stable for surgery.  I have reviewed the patient's chart and labs.  Questions were answered to the patient's satisfaction.     Dorsel Flinn, D

## 2012-12-20 NOTE — Anesthesia Preprocedure Evaluation (Addendum)
Anesthesia Evaluation  Patient identified by MRN, date of birth, ID band Patient awake    Reviewed: Allergy & Precautions, H&P , NPO status , Patient's Chart, lab work & pertinent test results  Airway Mallampati: II TM Distance: >3 FB Neck ROM: full    Dental  (+) Teeth Intact and Dental Advidsory Given   Pulmonary neg pulmonary ROS,  breath sounds clear to auscultation        Cardiovascular negative cardio ROS  Rhythm:regular Rate:Normal     Neuro/Psych negative neurological ROS  negative psych ROS   GI/Hepatic negative GI ROS, Neg liver ROS,   Endo/Other  negative endocrine ROS  Renal/GU negative Renal ROS     Musculoskeletal   Abdominal   Peds  Hematology   Anesthesia Other Findings   Reproductive/Obstetrics negative OB ROS                           Anesthesia Physical Anesthesia Plan  ASA: II  Anesthesia Plan: General LMA   Post-op Pain Management: MAC Combined w/ Regional for Post-op pain   Induction:   Airway Management Planned:   Additional Equipment:   Intra-op Plan:   Post-operative Plan:   Informed Consent: I have reviewed the patients History and Physical, chart, labs and discussed the procedure including the risks, benefits and alternatives for the proposed anesthesia with the patient or authorized representative who has indicated his/her understanding and acceptance.   Dental Advisory Given  Plan Discussed with: Anesthesiologist, CRNA and Surgeon  Anesthesia Plan Comments:         Anesthesia Quick Evaluation

## 2012-12-20 NOTE — Op Note (Signed)
12/20/2012  3:04 PM  PATIENT:  [name]    PRE-OPERATIVE DIAGNOSIS:  RIGHT ANKLE TRIMALLEOLAR FRACTURE  POST-OPERATIVE DIAGNOSIS:  Same  PROCEDURE:  RIGHT OPEN REDUCTION INTERNAL FIXATION (ORIF) POSTERIOR LIP DISTAL TIBIOFIBULAR FRACTURE  SURGEON:  Ellarie Picking, D, MD  ASSISTANT: None  ANESTHESIA:   Gen  PREOPERATIVE INDICATIONS:  [name] is a  32 y.o. female with a diagnosis of RIGHT ANKLE TRIMALLEOLAR FRACTURE who failed conservative measures and elected for surgical management.    The risks benefits and alternatives were discussed with the patient preoperatively including but not limited to the risks of infection, bleeding, nerve injury, cardiopulmonary complications, the need for revision surgery, among others, and the patient was willing to proceed.  OPERATIVE IMPLANTS: 1/3 tubular plate and canulated screws  OPERATIVE FINDINGS: Unstable ankle fracture. Stable syndesmosis post op  BLOOD LOSS: min  COMPLICATIONS: none  TOURNIQUET TIME: 105  OPERATIVE PROCEDURE:  Patient was identified in the preoperative holding area and site was marked by me He was transported to the operating theater and placed on the table in supine position taking care to pad all bony prominences. After a preincinduction time out anesthesia was induced. The Right lower extremity was prepped and draped in normal sterile fashion and a pre-incision timeout was performed. [name] received Ancef for preoperative antibiotics.   I made a lateral incision of roughly 7 cm dissection was carried down sharply to the distal fibula and then spreading dissection was used proximally to protect the superficial peroneal nerve. I sharply incised the periosteum and took care to protect the peroneal tendons. I then debrided the fracture site and performed a reduction maneuver which was held in place with a clamp.   I placed 3 lag screw across the fracture  I then selected an 8-hole one third tubular  plate and placed in a neutralization fashion care was taken distally so as not to penetrate the joint with the cancellus screws.  I then turned my attention medially where I created a 4 cm incision and dissected sharply down to the medial Mal fracture taking care to protect the saphenous vein. I debrided the fracture and reduced and held in place with a tenaculum. I then drilled and placed 2 partially threaded 44 mm cannulated screws one anterior and one posterior across the fracture.  I then stressed the syndesmosis and it was stable  The wound was then thoroughly irrigated and closed using a 0 Vicryl and absorbable Monocryl sutures. He was placed in a short leg splint.   POST OPERATIVE PLAN: Non-weightbearing. DVT prophylaxis will consist of ambulation and aspirin 81 daily

## 2012-12-20 NOTE — Anesthesia Postprocedure Evaluation (Signed)
Anesthesia Post Note  Patient: [name]  Procedure(s) Performed: Procedure(s) (LRB): RIGHT OPEN REDUCTION INTERNAL FIXATION (ORIF) POSTERIOR LIP DISTAL TIBIOFIBULAR FRACTURE (Right)  Anesthesia type: general  Patient location: PACU  Post pain: Pain level controlled  Post assessment: Patient's Cardiovascular Status Stable  Last Vitals:  Filed Vitals:   12/20/12 1615  BP: 119/61  Pulse: 88  Temp:   Resp: 15    Post vital signs: Reviewed and stable  Level of consciousness: sedated  Complications: No apparent anesthesia complications

## 2012-12-20 NOTE — Anesthesia Procedure Notes (Signed)
Procedure Name: LMA Insertion Date/Time: 12/20/2012 1:01 PM Performed by: Burna Cash Pre-anesthesia Checklist: Patient identified, Emergency Drugs available, Suction available and Patient being monitored Patient Re-evaluated:Patient Re-evaluated prior to inductionOxygen Delivery Method: Circle System Utilized Preoxygenation: Pre-oxygenation with 100% oxygen Intubation Type: IV induction Ventilation: Mask ventilation without difficulty LMA: LMA inserted LMA Size: 4.0 Number of attempts: 1 Airway Equipment and Method: bite block Placement Confirmation: positive ETCO2 Tube secured with: Tape Dental Injury: Teeth and Oropharynx as per pre-operative assessment

## 2012-12-20 NOTE — Transfer of Care (Signed)
Immediate Anesthesia Transfer of Care Note  Patient: [name]  Procedure(s) Performed: Procedure(s): RIGHT OPEN REDUCTION INTERNAL FIXATION (ORIF) POSTERIOR LIP DISTAL TIBIOFIBULAR FRACTURE (Right)  Patient Location: PACU  Anesthesia Type:GA combined with regional for post-op pain  Level of Consciousness: awake, alert  and oriented  Airway & Oxygen Therapy: Patient Spontanous Breathing and Patient connected to face mask oxygen  Post-op Assessment: Report given to PACU RN and Post -op Vital signs reviewed and stable  Post vital signs: Reviewed and stable  Complications: No apparent anesthesia complications

## 2012-12-20 NOTE — H&P (Signed)
      ORTHOPAEDIC CONSULTATION  REQUESTING PHYSICIAN: Sheral Apley, MD  Chief Complaint: Right ankle fracture  HPI: [name] is a 32 y.o. female who complains of  Occurred during roller [place]  Past Medical History  Diagnosis Date  . Trimalleolar fracture of right ankle 12/13/2012   Past Surgical History  Procedure Laterality Date  . Appendectomy  1999  . Laparoscopic ovarian cystectomy Left 03/11/2007  . Breast lumpectomy Left 2008   History   Social History  . Marital Status: Single    Spouse Name: N/A    Number of Children: N/A  . Years of Education: N/A   Social History Main Topics  . Smoking status: Current Every Day Smoker -- 1.00 packs/day for 12 years    Types: Cigarettes  . Smokeless tobacco: Never Used  . Alcohol Use: Yes     Comment: occasionally  . Drug Use: No  . Sexual Activity: Yes    Birth Control/ Protection: IUD   Other Topics Concern  . None   Social History Narrative  . None   Family History  Problem Relation Age of Onset  . Diabetes Mother   . Cancer Mother 44    breast  . Hypertension Mother   . Diabetes Maternal Grandmother   . Hypertension Maternal Grandmother    No Known Allergies Prior to Admission medications   Medication Sig Start Date End Date Taking? Authorizing Provider  levonorgestrel (MIRENA) 20 MCG/24HR IUD 1 each by Intrauterine route once.   Yes Historical Provider, MD  oxyCODONE-acetaminophen (PERCOCET/ROXICET) 5-325 MG per tablet Take 1 tablet by mouth every 4 (four) hours as needed for pain.   Yes Historical Provider, MD   No results found.  Positive ROS: All other systems have been reviewed and were otherwise negative with the exception of those mentioned in the HPI and as above.  Labs cbc  Recent Labs  12/20/12 1137  HGB 13.1    Labs inflam No results found for this basename: ESR, CRP,  in the last 72 hours  Labs coag No results found for this basename: INR, PT, PTT,  in the last 72  hours  No results found for this basename: NA, K, CL, CO2, GLUCOSE, BUN, CREATININE, CALCIUM,  in the last 72 hours  Physical Exam: Filed Vitals:   12/20/12 1211  BP:   Pulse: 107  Temp:   Resp: 17   General: Alert, no acute distress Cardiovascular: No pedal edema Respiratory: No cyanosis, no use of accessory musculature GI: No organomegaly, abdomen is soft and non-tender Skin: No lesions in the area of chief complaint Neurologic: Sensation intact distally Psychiatric: Patient is competent for consent with normal mood and affect Lymphatic: No axillary or cervical lymphadenopathy  MUSCULOSKELETAL:  Pain with ROM, SKin benign. Other extremities are atraumatic with painless ROM and NVI.  Assessment: R bimal fracture  Plan: ORIF today Weight Bearing Status: NWB RLE PT VTE px:ASA 325   Margarita Rana, D, MD Cell [phone]   12/20/2012 12:36 PM

## 2012-12-20 NOTE — Progress Notes (Signed)
Assisted Dr. Singer with right, ultrasound guided, popliteal/saphenous block. Side rails up, monitors on throughout procedure. See vital signs in flow sheet. Tolerated Procedure well. 

## 2012-12-23 ENCOUNTER — Encounter (HOSPITAL_BASED_OUTPATIENT_CLINIC_OR_DEPARTMENT_OTHER): Payer: Self-pay | Admitting: Orthopedic Surgery

## 2014-09-29 ENCOUNTER — Ambulatory Visit: Payer: Managed Care, Other (non HMO) | Admitting: Podiatry

## 2016-03-13 ENCOUNTER — Other Ambulatory Visit: Payer: Self-pay | Admitting: Family Medicine

## 2016-03-13 DIAGNOSIS — E041 Nontoxic single thyroid nodule: Secondary | ICD-10-CM

## 2016-03-16 ENCOUNTER — Ambulatory Visit
Admission: RE | Admit: 2016-03-16 | Discharge: 2016-03-16 | Disposition: A | Discharge status: Home / Self Care | Payer: Managed Care, Other (non HMO) | Source: Ambulatory Visit | Attending: Family Medicine | Admitting: Family Medicine

## 2016-03-16 DIAGNOSIS — E041 Nontoxic single thyroid nodule: Secondary | ICD-10-CM

## 2019-01-10 IMAGING — US US THYROID
1 series · 14 of 25 positions shown · non-contrast
Comparison: None.

CLINICAL DATA: 35-year-old female with a history of thyroid nodule
on physical exam

EXAM:
THYROID ULTRASOUND
TECHNIQUE: Ultrasound examination of the thyroid gland and adjacent soft
tissues was performed.

[Series 1: us thyroid · 0.05mm/px · 14 of 37 slices shown]
[im 1/37]
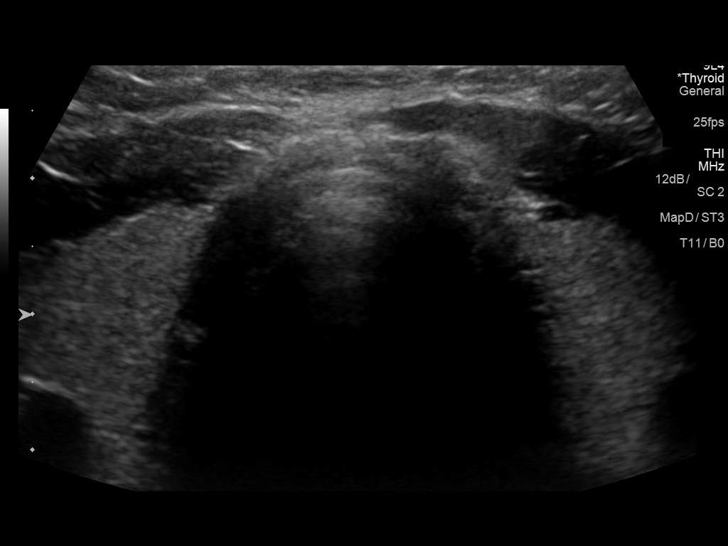
[im 4/37]
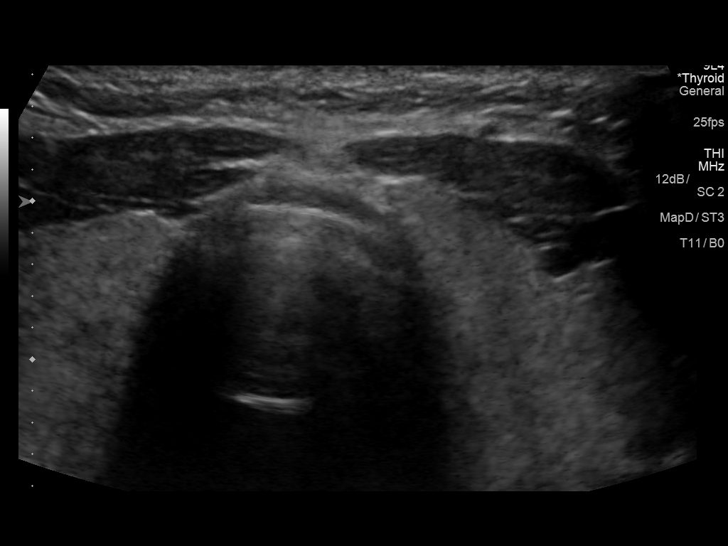
[im 7/37]
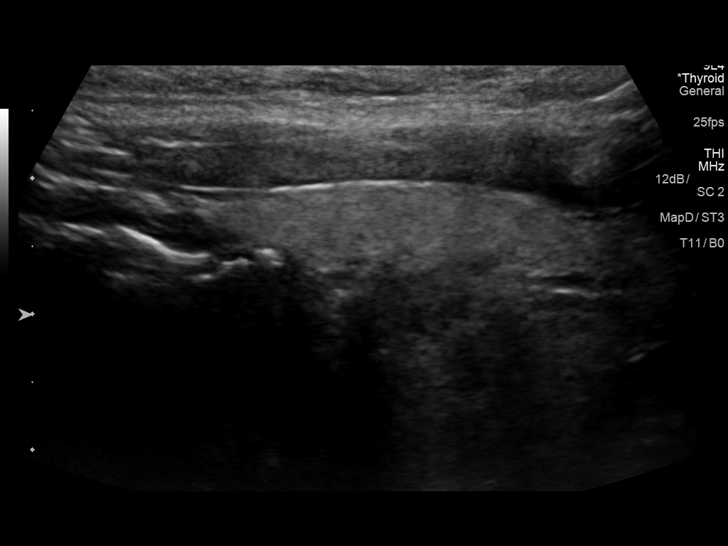
[im 10/37]
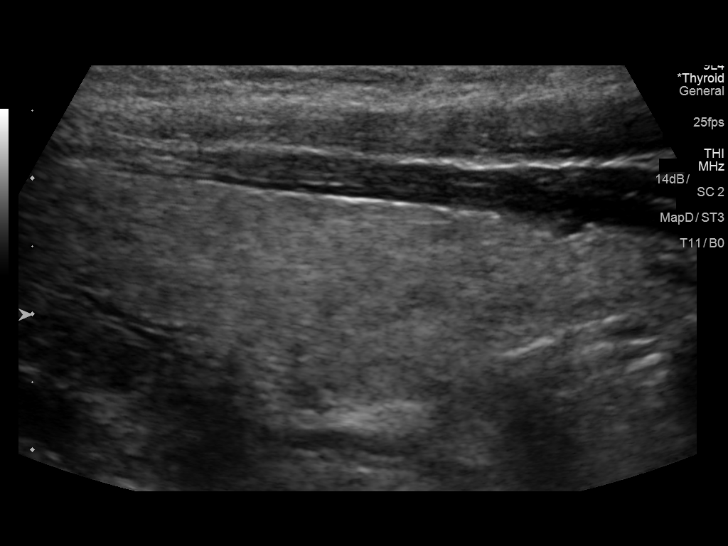
[im 13/37]
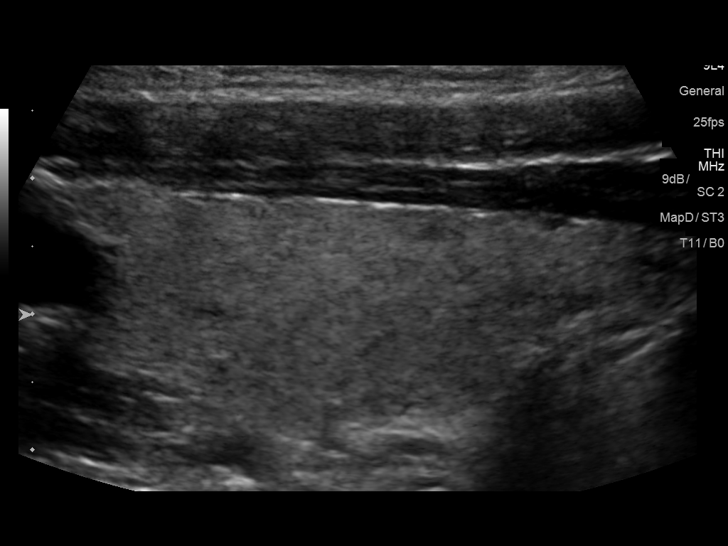
[im 14/37]
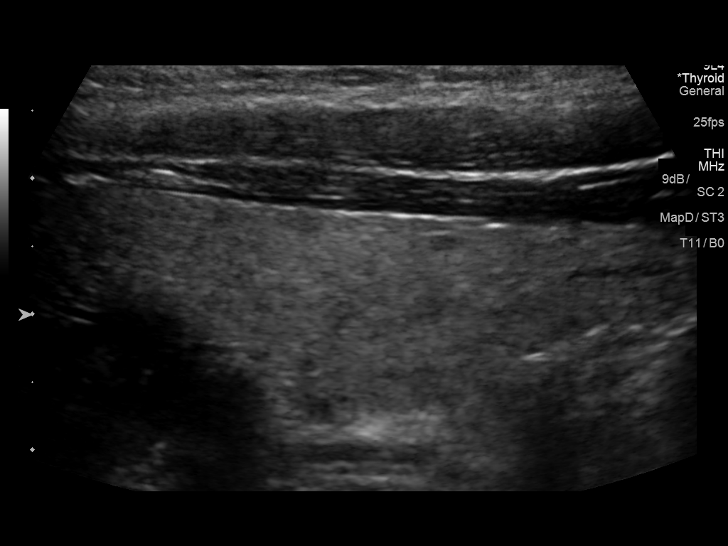
[im 17/37]
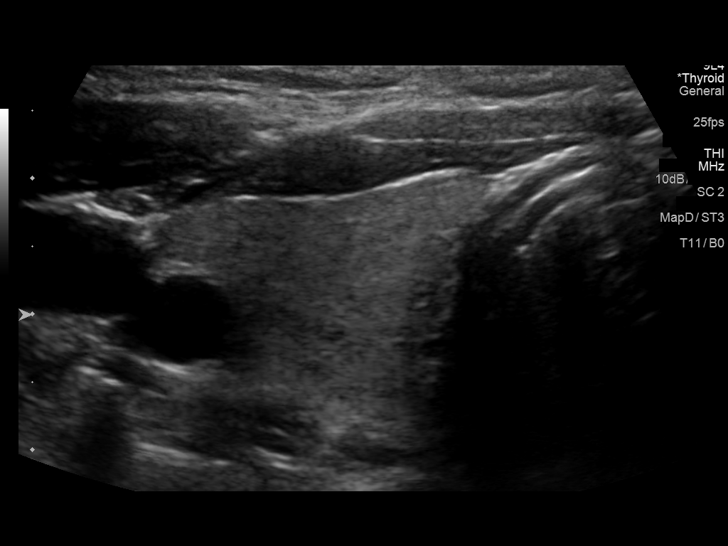
[im 20/37]
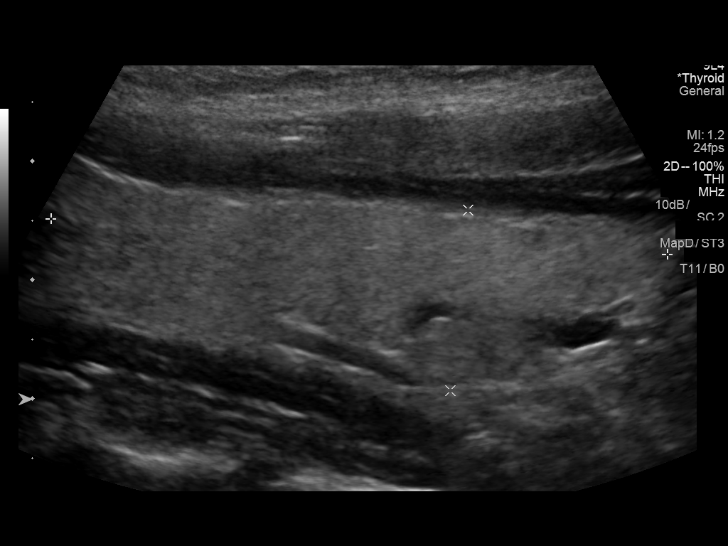
[im 23/37]
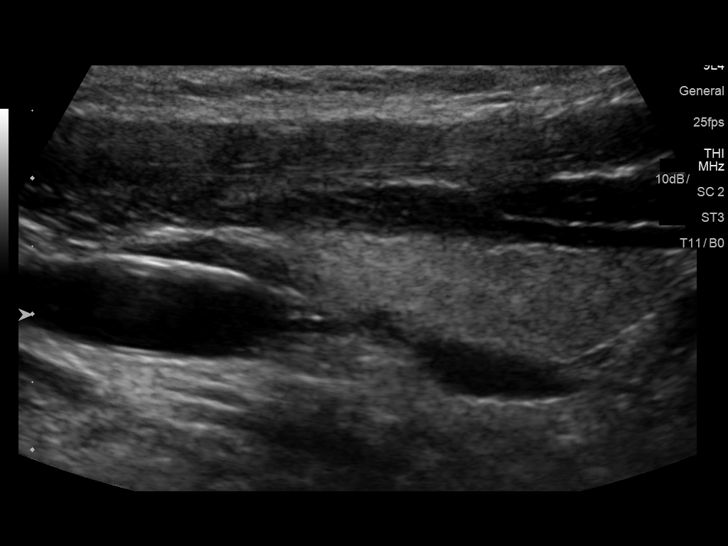
[im 25/37]
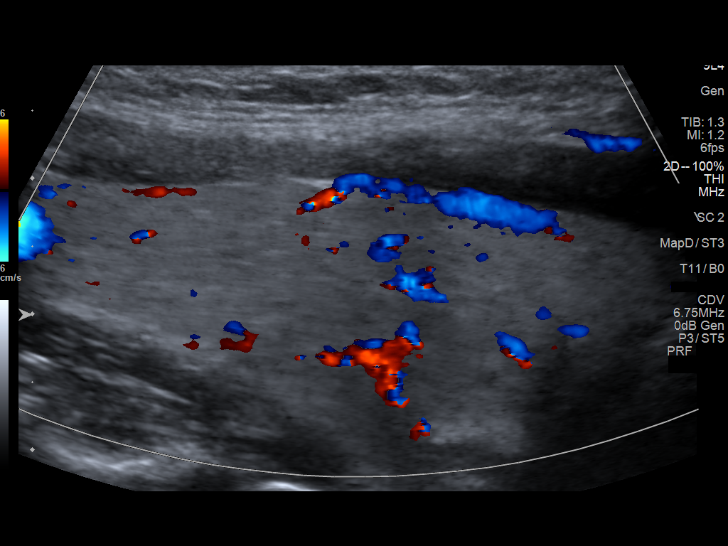
[im 28/37]
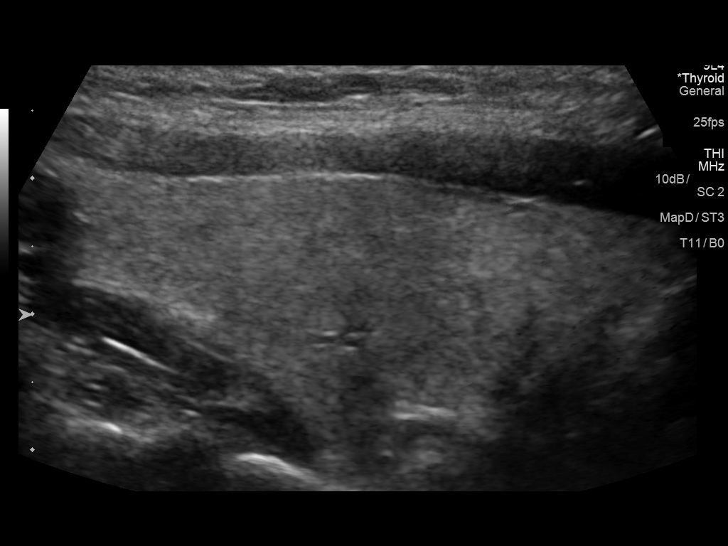
[im 31/37]
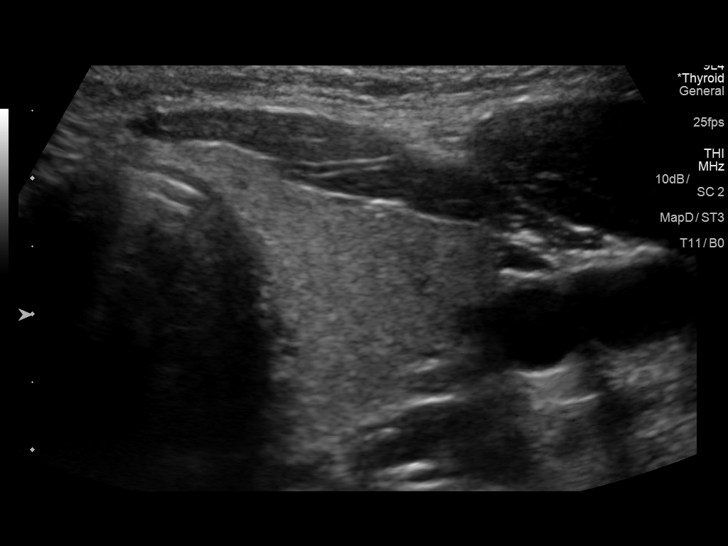
[im 34/37]
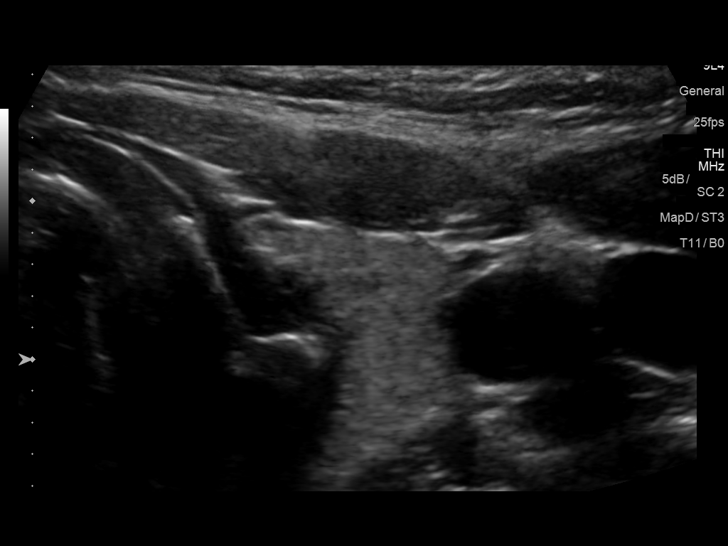
[im 37/37]
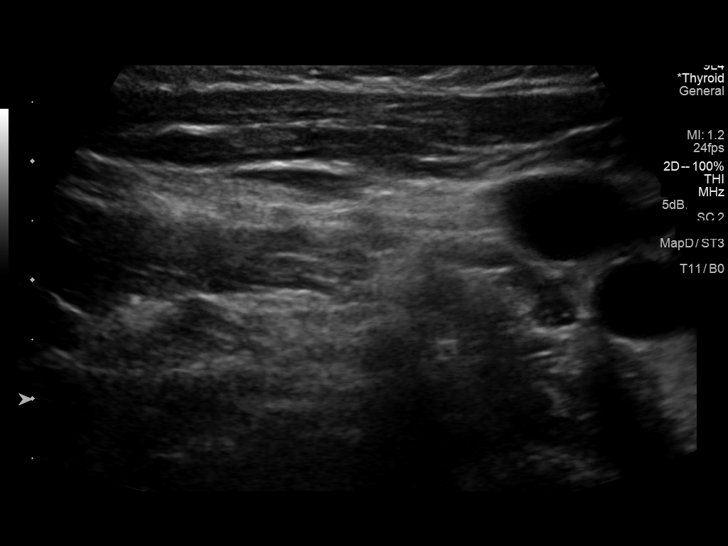

[14 of 25 positions shown; findings below may reference images not displayed]

FINDINGS: Parenchymal Echotexture: Mildly heterogenous

Isthmus: 0.2 cm

Right lobe: 5.0 cm x 2.0 cm x 2.2 cm

Left lobe: 5.2 cm x 1.3 cm x 1.9 cm

_________________________________________________________

Estimated total number of nodules >/= 1 cm: 0

Number of spongiform nodules >/=  2 cm not described below (TR1): 0

Number of mixed cystic and solid nodules >/= 1.5 cm not described
below (TR2): 0

No discrete nodules are seen within the thyroid gland.
IMPRESSION: Heterogeneous thyroid tissue may reflect medical thyroid disease.

Sonographic survey is negative for discrete nodule.

The above is in keeping with the ACR TI-RADS recommendations - [HOSPITAL] 2575;[DATE].

## 2019-05-24 ENCOUNTER — Ambulatory Visit: Payer: Managed Care, Other (non HMO) | Attending: Internal Medicine

## 2019-05-24 DIAGNOSIS — Z23 Encounter for immunization: Secondary | ICD-10-CM

## 2019-05-24 NOTE — Progress Notes (Signed)
   Covid-19 Vaccination Clinic  Name:  [name]    MRN: [id number] DOB: 07/25/1980  05/24/2019  [name] was observed post Covid-19 immunization for 15 minutes without incident. She was provided with Vaccine Information Sheet and instruction to access the V-Safe system.   [name] was instructed to call 911 with any severe reactions post vaccine: Marland Kitchen Difficulty breathing  . Swelling of face and throat  . A fast heartbeat  . A bad rash all over body  . Dizziness and weakness   Immunizations Administered    Name Date Dose VIS Date Route   Pfizer COVID-19 Vaccine 05/24/2019 10:02 AM 0.3 mL 01/31/2019 Intramuscular   Manufacturer: [employer], [employer]   Lot: [id number]   NDC: [address]-9

## 2019-06-17 ENCOUNTER — Ambulatory Visit: Payer: Managed Care, Other (non HMO) | Attending: Internal Medicine

## 2019-06-17 DIAGNOSIS — Z23 Encounter for immunization: Secondary | ICD-10-CM

## 2019-06-17 NOTE — Progress Notes (Signed)
   Covid-19 Vaccination Clinic  Name:  CHARLANE WESTRY    MRN: [id number] DOB: 1981-01-20  06/17/2019  Ms. Spiewak was observed post Covid-19 immunization for 15 minutes without incident. She was provided with Vaccine Information Sheet and instruction to access the V-Safe system.   Ms. Biber was instructed to call 911 with any severe reactions post vaccine: Marland Kitchen Difficulty breathing  . Swelling of face and throat  . A fast heartbeat  . A bad rash all over body  . Dizziness and weakness   Immunizations Administered    Name Date Dose VIS Date Route   Pfizer COVID-19 Vaccine 06/17/2019  4:13 PM 0.3 mL 04/16/2018 Intramuscular   Manufacturer: [employer], [employer]   Lot: [id number]   NDC: [address]-8
